# Patient Record
Sex: Male | Born: 2000 | Race: Black or African American | Hispanic: No | Marital: Single | Smoking: Never smoker
Health system: Southern US, Community
[De-identification: ages and names within clinical notes are randomized; demographics above are authoritative.]

## PROBLEM LIST (undated history)

## (undated) DIAGNOSIS — IMO0002 Reserved for concepts with insufficient information to code with codable children: Secondary | ICD-10-CM

## (undated) DIAGNOSIS — S62609A Fracture of unspecified phalanx of unspecified finger, initial encounter for closed fracture: Secondary | ICD-10-CM

## (undated) HISTORY — PX: TONSILLECTOMY AND ADENOIDECTOMY: SHX28

## (undated) HISTORY — DX: Fracture of unspecified phalanx of unspecified finger, initial encounter for closed fracture: S62.609A

## (undated) HISTORY — PX: HERNIA REPAIR: SHX51

## (undated) HISTORY — DX: Reserved for concepts with insufficient information to code with codable children: IMO0002

## (undated) NOTE — *Deleted (*Deleted)
Pt is here with a left hand injury that happened, pt has taken to relieve discomfort.

---
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
## 2018-01-23 ENCOUNTER — Ambulatory Visit: Payer: 59 | Admitting: Pediatrics

## 2018-10-14 ENCOUNTER — Encounter: Payer: Self-pay | Admitting: Pediatrics

## 2020-11-10 ENCOUNTER — Other Ambulatory Visit: Payer: Self-pay

## 2020-11-10 ENCOUNTER — Ambulatory Visit
Admission: EM | Admit: 2020-11-10 | Discharge: 2020-11-10 | Disposition: A | Discharge status: Home / Self Care | Payer: No Typology Code available for payment source | Attending: Family Medicine | Admitting: Family Medicine

## 2020-11-10 ENCOUNTER — Ambulatory Visit (INDEPENDENT_AMBULATORY_CARE_PROVIDER_SITE_OTHER): Payer: No Typology Code available for payment source

## 2020-11-10 DIAGNOSIS — M79642 Pain in left hand: Secondary | ICD-10-CM

## 2020-11-10 DIAGNOSIS — W19XXXA Unspecified fall, initial encounter: Secondary | ICD-10-CM

## 2020-11-10 DIAGNOSIS — S6392XA Sprain of unspecified part of left wrist and hand, initial encounter: Secondary | ICD-10-CM

## 2020-11-10 DIAGNOSIS — S66912A Strain of unspecified muscle, fascia and tendon at wrist and hand level, left hand, initial encounter: Secondary | ICD-10-CM

## 2020-11-10 NOTE — ED Provider Notes (Signed)
[facility]   [id number] 11/10/20 Arrival Time: 1738  SH:FWYOV PAIN  SUBJECTIVE: History from: patient. Ruben George is a 19 y.o. male complains of left hand and thumb pain that began about 3 days ago.  Reports that he fell at work and caught himself on his left hand.  Reports bruising to the base of the left hand.  Has not attempted treatment at home.  Reports pain is worse when putting pressure on the hand like trying to lift himself up out of a chair using his arms. Denies similar symptoms in the past.  Denies fever, chills, erythema, weakness, numbness and tingling, saddle paresthesias, loss of bowel or bladder function.      ROS: As per HPI.  All other pertinent ROS negative.     Past Medical History:  Diagnosis Date  . Finger fracture    multiple  . 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 HERNIA REPAIR    . TONSILLECTOMY AND ADENOIDECTOMY     No Known Allergies No current facility-administered medications on file prior to encounter.   Current Outpatient Medications on File Prior to Encounter  Medication Sig Dispense Refill  . TETRACYCLINE HCL PO Take by mouth.     Social History   Socioeconomic History  . Marital status: Single    Spouse name: n/a  . Number of children: 0  . Years of education: Not on file  . Highest education level: Not on file  Occupational History  . Occupation: Consulting civil engineer  Tobacco Use  . Smoking status: Never Smoker  . Smokeless tobacco: Never Used  Substance and Sexual Activity  . Alcohol use: No    Alcohol/week: 0.0 standard drinks  . Drug use: No  . Sexual activity: Not on file  Other Topics Concern  . Not on file  Social History Narrative   Lives with his mother, brother and sister.   His father lives in [place], with his sister or mother.   Social Determinants of Health   Financial Resource Strain:   . Difficulty of Paying Living Expenses: Not on file    Food Insecurity:   . Worried About Programme researcher, broadcasting/film/video in the Last Year: Not on file  . Ran Out of Food in the Last Year: Not on file  Transportation Needs:   . Lack of Transportation (Medical): Not on file  . Lack of Transportation (Non-Medical): Not on file  Physical Activity:   . Days of Exercise per Week: Not on file  . Minutes of Exercise per Session: Not on file  Stress:   . Feeling of Stress : Not on file  Social Connections:   . Frequency of Communication with Friends and Family: Not on file  . Frequency of Social Gatherings with Friends and Family: Not on file  . Attends Religious Services: Not on file  . Active Member of Clubs or Organizations: Not on file  . Attends Banker Meetings: Not on file  . Marital Status: Not on file  Intimate Partner Violence:   . Fear of Current or Ex-Partner: Not on file  . Emotionally Abused: Not on file  . Physically Abused: Not on file  . Sexually Abused: Not on file   Family History  Problem Relation Age of Onset  . Autism spectrum disorder Brother   . Mental illness Brother        bipolar disorder, ADHD  . Hypertension Mother   .  Sarcoidosis Father     OBJECTIVE:  Vitals:   11/10/20 1811 11/10/20 1812  BP: 129/71   Pulse: (!) 51   Resp: 17   Temp: 98.6 F (37 C)   TempSrc: Oral   SpO2: 98%   Height:  5\' 9"  (1.753 m)    General appearance: ALERT; in no acute distress.  Head: NCAT Lungs: Normal respiratory effort CV: pulses 2+ bilaterally. Cap refill < 2 seconds Musculoskeletal:  Inspection: Skin warm, dry, clear and intact. Bruising and swelling to L palm between L thumb and L wirst  Palpation: Nontender to palpation ROM: FROM active and passive Skin: warm and dry Neurologic: Ambulates without difficulty; Sensation intact about the upper/ lower extremities Psychological: alert and cooperative; normal mood and affect  DIAGNOSTIC STUDIES:  DG Hand Complete Left  Result Date: 11/10/2020 CLINICAL  DATA:  Fall injury EXAM: LEFT HAND - COMPLETE 3+ VIEW COMPARISON:  None. FINDINGS: There is no evidence of fracture or dislocation. There is no evidence of arthropathy or other focal bone abnormality. Soft tissues are unremarkable. IMPRESSION: Negative. Electronically Signed   By: 11/12/2020 M.D.   On: 11/10/2020 19:21     ASSESSMENT & PLAN:  1. Sprain and strain of left hand   2. Left hand pain   3. Fall, initial encounter    Likely hand sprain from the fall Work note provided  Continue conservative management of rest, ice, and gentle stretches Take ibuprofen as needed for pain relief (may cause abdominal discomfort, ulcers, and GI bleeds avoid taking with other NSAIDs)  Follow up with PCP if symptoms persist Return or go to the ER if you have any new or worsening symptoms (fever, chills, chest pain, abdominal pain, changes in bowel or bladder habits, pain radiating into lower legs)    Reviewed expectations re: course of current medical issues. Questions answered. Outlined signs and symptoms indicating need for more acute intervention. Patient verbalized understanding. After Visit Summary given.       11/12/2020, NP 11/10/20 1944

## 2020-11-10 NOTE — Discharge Instructions (Addendum)
X-ray was negative today for any fractures or dislocation  May take ibuprofen as needed.  You may wear a brace as needed.  We cannot rule out soft tissue injury in the office today.  You may apply ice to the area  Follow-up with orthopedics if symptoms are persisting.

## 2021-02-02 ENCOUNTER — Encounter: Payer: Self-pay | Admitting: Emergency Medicine

## 2021-02-02 ENCOUNTER — Ambulatory Visit
Admission: EM | Admit: 2021-02-02 | Discharge: 2021-02-02 | Disposition: A | Discharge status: Home / Self Care | Payer: No Typology Code available for payment source

## 2021-02-02 ENCOUNTER — Other Ambulatory Visit: Payer: Self-pay

## 2021-02-02 DIAGNOSIS — M79672 Pain in left foot: Secondary | ICD-10-CM

## 2021-02-02 DIAGNOSIS — M7750 Other enthesopathy of unspecified foot: Secondary | ICD-10-CM | POA: Diagnosis not present

## 2021-02-02 NOTE — ED Provider Notes (Signed)
[facility]   [id number] 02/02/21 Arrival Time: 1441  YH:CWCBJ PAIN  SUBJECTIVE: History from: patient. Morton A Getter is a 20 y.o. male complains of left foot  pain that began 3-4 days ago. States that she does run and work out. Denies a precipitating event or specific injury.  Localizes the pain to the sole of the midfoot. Describes the pain as intermittent and achy in character. Has tried OTC medications without relief. Symptoms are made worse with activity. Denies similar symptoms in the past. Denies fever, chills, erythema, ecchymosis, effusion, weakness, numbness and tingling, saddle paresthesias, loss of bowel or bladder function.      ROS: As per HPI.  All other pertinent ROS negative.     Past Medical History:  Diagnosis Date  . Finger fracture    multiple  . 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 HERNIA REPAIR    . TONSILLECTOMY AND ADENOIDECTOMY     No Known Allergies No current facility-administered medications on file prior to encounter.   Current Outpatient Medications on File Prior to Encounter  Medication Sig Dispense Refill  . TETRACYCLINE HCL PO Take by mouth.     Social History   Socioeconomic History  . Marital status: Single    Spouse name: n/a  . Number of children: 0  . Years of education: Not on file  . Highest education level: Not on file  Occupational History  . Occupation: Consulting civil engineer  Tobacco Use  . Smoking status: Never Smoker  . Smokeless tobacco: Never Used  Substance and Sexual Activity  . Alcohol use: No    Alcohol/week: 0.0 standard drinks  . Drug use: No  . Sexual activity: Not on file  Other Topics Concern  . Not on file  Social History Narrative   Lives with his mother, brother and sister.   His father lives in [place], with his sister or mother.   Social Determinants of Health   Financial Resource Strain: Not on file  Food Insecurity: Not on file   Transportation Needs: Not on file  Physical Activity: Not on file  Stress: Not on file  Social Connections: Not on file  Intimate Partner Violence: Not on file   Family History  Problem Relation Age of Onset  . Autism spectrum disorder Brother   . Mental illness Brother        bipolar disorder, ADHD  . Hypertension Mother   . Sarcoidosis Father     OBJECTIVE:  Vitals:   02/02/21 1507  BP: 115/74  Pulse: (!) 48  Resp: 17  Temp: 98.3 F (36.8 C)  TempSrc: Oral  SpO2: 97%    General appearance: ALERT; in no acute distress.  Head: NCAT Lungs: Normal respiratory effort CV: pulses 2+ bilaterally. Cap refill < 2 seconds Musculoskeletal:  Inspection: Skin warm, dry, clear and intact No erythema, effusion noted Palpation: mid-lateral sole of L foot tender to palpation ROM: FROM active and passive  Skin: warm and dry Neurologic: Ambulates without difficulty; Sensation intact about the upper/ lower extremities Psychological: alert and cooperative; normal mood and affect  DIAGNOSTIC STUDIES:  No results found.   ASSESSMENT & PLAN:  1. Tendonitis of foot   2. Left foot pain     Continue conservative management of rest, ice, and gentle stretches Take ibuprofen as needed for pain relief (may cause abdominal discomfort, ulcers, and GI bleeds avoid taking with other NSAIDs) Follow up with PCP  if symptoms persist Return or go to the ER if you have any new or worsening symptoms (fever, chills, chest pain, abdominal pain, changes in bowel or bladder habits, pain radiating into lower legs)  Reviewed expectations re: course of current medical issues. Questions answered. Outlined signs and symptoms indicating need for more acute intervention. Patient verbalized understanding. After Visit Summary given.       Moshe Cipro, NP 02/02/21 1526

## 2021-02-02 NOTE — Discharge Instructions (Addendum)
Take ibuprofen as needed. Rest and elevate your foot. Apply ice packs 2-3 times a day for up to 20 minutes each.    Follow up with your primary care provider or an orthopedist if you symptoms continue or worsen; Or if you develop new symptoms, such as numbness, tingling, or weakness.

## 2021-02-02 NOTE — ED Triage Notes (Signed)
LT foot pain on the bottom of foot for the past couple of days. No know injury.  States he does run and work out

## 2021-04-21 ENCOUNTER — Encounter: Payer: Self-pay | Admitting: Emergency Medicine

## 2021-04-21 ENCOUNTER — Other Ambulatory Visit: Payer: Self-pay

## 2021-04-21 ENCOUNTER — Ambulatory Visit
Admission: EM | Admit: 2021-04-21 | Discharge: 2021-04-21 | Disposition: A | Discharge status: Home / Self Care | Payer: No Typology Code available for payment source | Attending: Family Medicine | Admitting: Family Medicine

## 2021-04-21 DIAGNOSIS — S86912A Strain of unspecified muscle(s) and tendon(s) at lower leg level, left leg, initial encounter: Secondary | ICD-10-CM

## 2021-04-21 MED ORDER — NAPROXEN 375 MG PO TABS: 375.0000 mg | ORAL_TABLET | Freq: Two times a day (BID) | ORAL | 0 refills | Status: AC

## 2021-04-21 NOTE — ED Provider Notes (Signed)
[facility]    CSN: [id number] Arrival date & time: 04/21/21  1324      History   Chief Complaint Chief Complaint  Patient presents with  . Knee Pain    HPI Ruben George is a 20 y.o. male.   HPI   Patient in with left knee pressure and clicking since weight lifting a few days ago. He applied ice to knee the day of injury. He denies pain involving the left knee but endorses abnormal sensation of pressure and clicking mid knee. No knee swelling or impaired ROM of knee. He has not taken medication since injury occurred.  Past Medical History:  Diagnosis Date  . Finger fracture    multiple  . 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   Past Surgical History:  Procedure Laterality Date  . HERNIA REPAIR    . TONSILLECTOMY AND ADENOIDECTOMY         Home Medications    Prior to Admission medications   Medication Sig Start Date End Date Taking? Authorizing Provider  naproxen (NAPROSYN) 375 MG tablet Take 1 tablet (375 mg total) by mouth 2 (two) times daily. 04/21/21  Yes Bing Neighbors, FNP  TETRACYCLINE HCL PO Take by mouth.    [provider]    Family History Family History  Problem Relation Age of Onset  . Autism spectrum disorder Brother   . Mental illness Brother        bipolar disorder, ADHD  . Hypertension Mother   . Sarcoidosis Father     Social History Social History   Tobacco Use  . Smoking status: Never Smoker  . Smokeless tobacco: Never Used  Substance Use Topics  . Alcohol use: No    Alcohol/week: 0.0 standard drinks  . Drug use: No     Allergies   Patient has no known allergies.   Review of Systems Review of Systems   Physical Exam Triage Vital Signs ED Triage Vitals [04/21/21 1410]  Enc Vitals Group     BP      Pulse      Resp      Temp      Temp src      SpO2      Weight      Height      Head Circumference      Peak Flow       Pain Score 3     Pain Loc      Pain Edu?      Excl. in GC?    No data found.  Updated Vital Signs BP 117/79 (BP Location: Right Arm)   Pulse 82   Temp 98 F (36.7 C) (Oral)   Resp 17   SpO2 99%   Visual Acuity Right Eye Distance:   Left Eye Distance:   Bilateral Distance:    Right Eye Near:   Left Eye Near:    Bilateral Near:     Physical Exam General appearance: alert, well developed, well nourished, cooperative Head: Normocephalic, without obvious abnormality, atraumatic Respiratory: Respirations even and unlabored, normal respiratory rate Heart: rate and rhythm normal. No gallop or murmurs noted on exam  Abdomen: BS +, no distention, no rebound tenderness, or no mass Extremities: Left knee normal ,gross deformities, full ROM, no crepitus noted Skin: Skin color, texture, turgor normal. No rashes seen  Psych: Appropriate mood and affect. Neurologic: GCS 15, normal  gait, normal coordination   UC Treatments / Results  Labs (all labs ordered are listed, but only abnormal results are displayed) Labs Reviewed - No data to display  EKG   Radiology No results found.  Procedures Procedures (including critical care time)  Medications Ordered in UC Medications - No data to display  Initial Impression / Assessment and Plan / UC Course  I have reviewed the triage vital signs and the nursing notes.  Pertinent labs & imaging results that were available during my care of the patient were reviewed by me and considered in my medical decision making (see chart for details).    Knee appears normal and intact with full ROM. Ace wrap, RICE, NSAIDS Orthopedics follow-up if symptoms persist. Final Clinical Impressions(s) / UC Diagnoses   Final diagnoses:  Strain of left knee, initial encounter   Discharge Instructions   None    ED Prescriptions    Medication Sig Dispense Auth. Provider   naproxen (NAPROSYN) 375 MG tablet Take 1 tablet (375 mg total) by mouth 2 (two)  times daily. 20 tablet Bing Neighbors, FNP     PDMP not reviewed this encounter.   Bing Neighbors, FNP 04/21/21 [phone]

## 2021-04-21 NOTE — ED Triage Notes (Addendum)
Pt had some pain a couple of days ago to LT knee after weightlifting.  Now pt reports he is having pressure to that area but no pain.  Ambulating with no difficulty from waiting room to exam room.

## 2021-08-20 IMAGING — DX DG HAND COMPLETE 3+V*L*
3 series · 3 of 3 positions shown · non-contrast
Comparison: None.

CLINICAL DATA: Fall injury

EXAM:
LEFT HAND - COMPLETE 3+ VIEW

[hand pa]
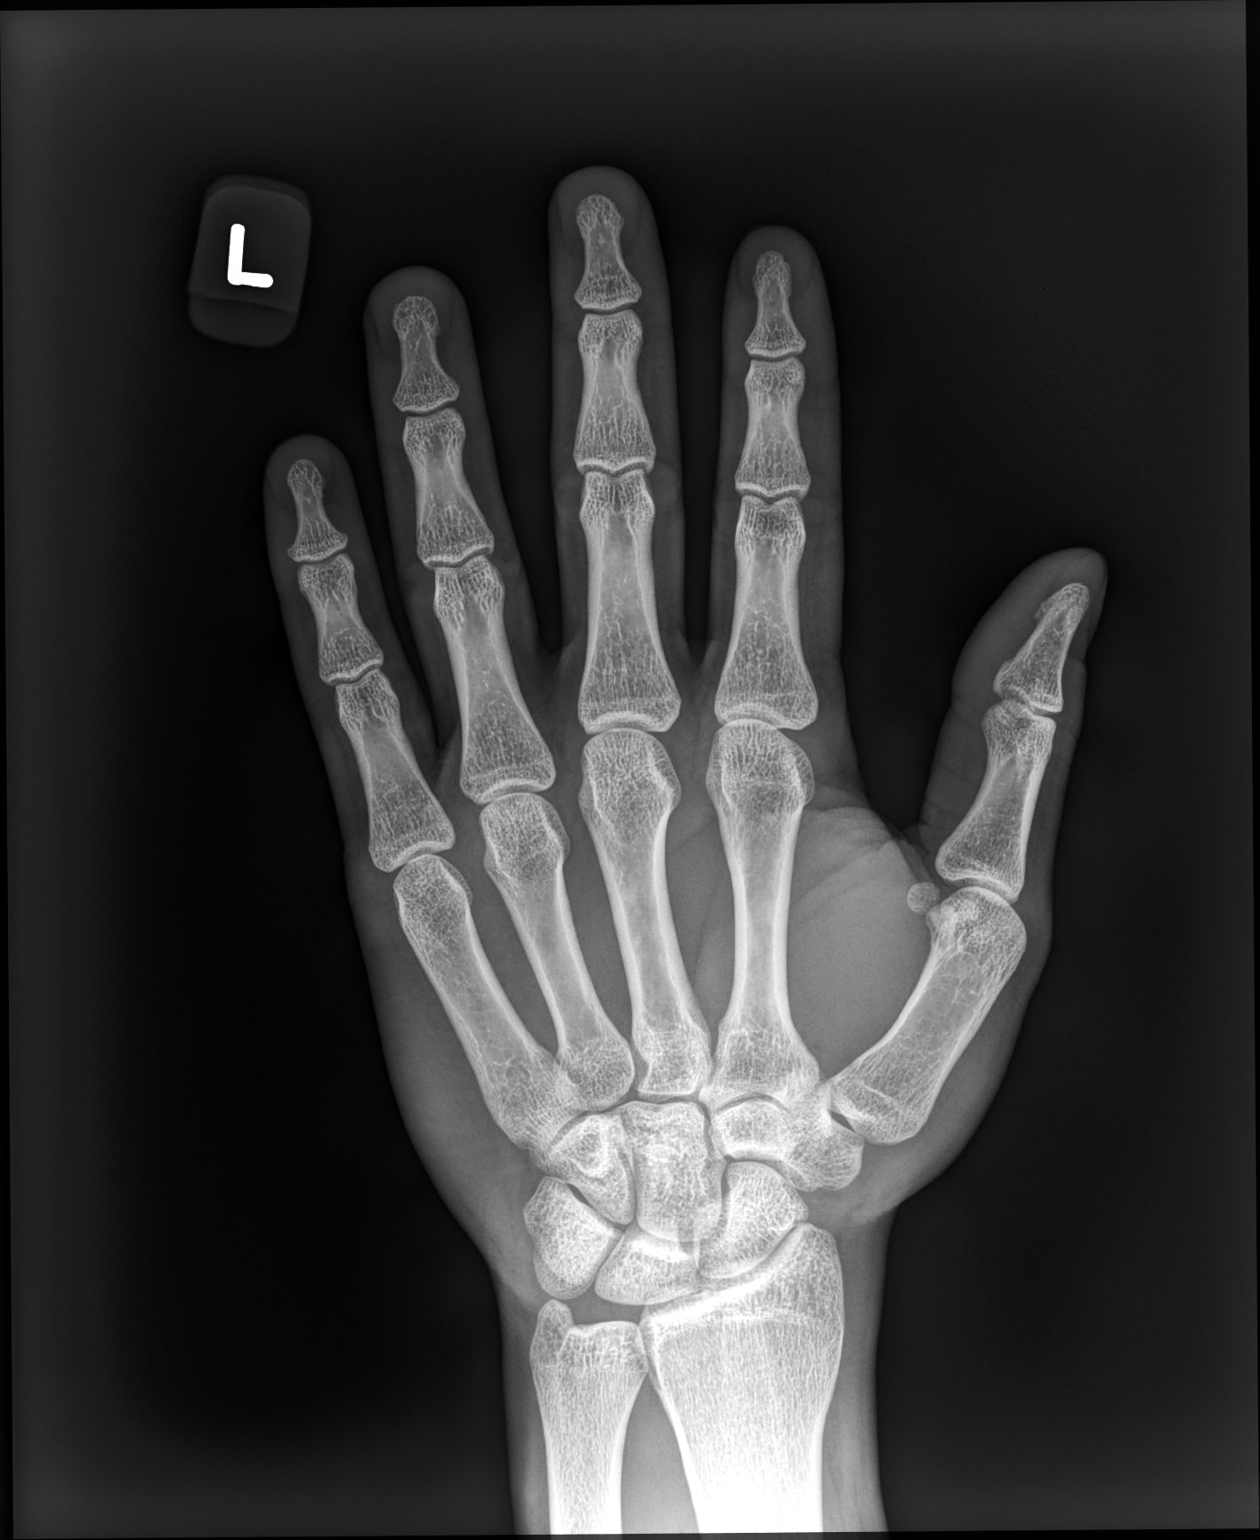

[hand mlo]
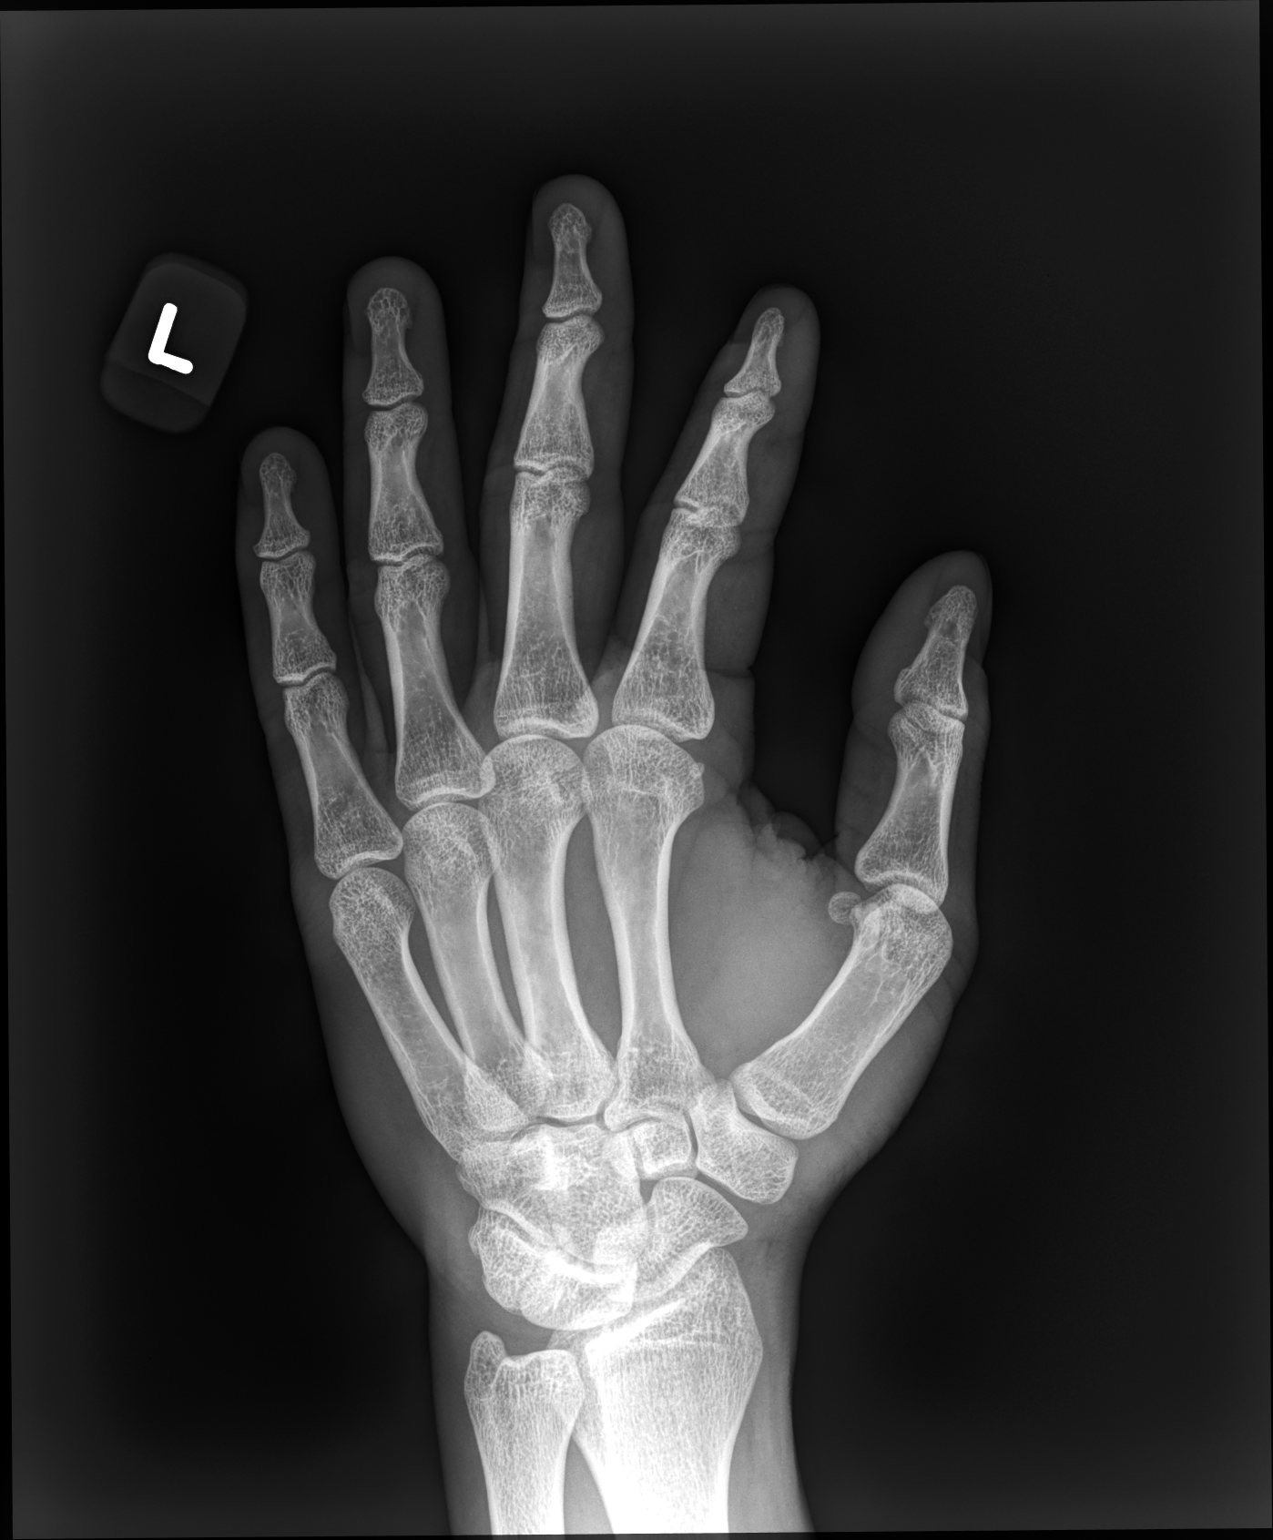

[hand lat]
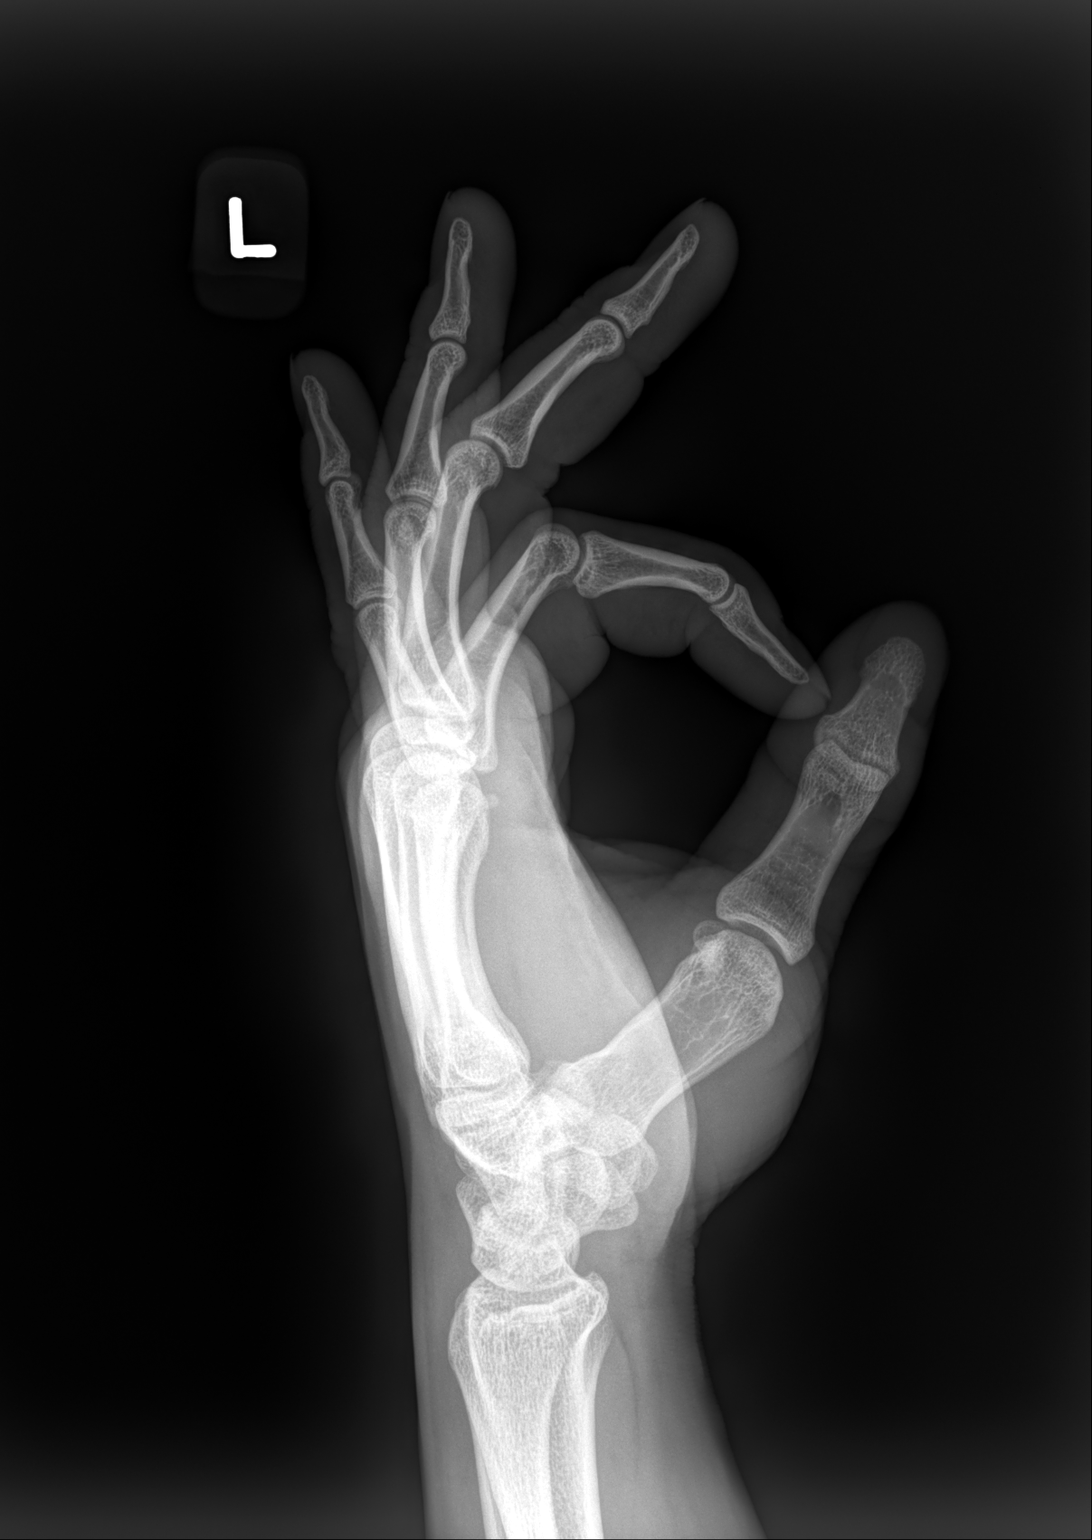

[3 of 3 positions shown; findings below may reference images not displayed]

FINDINGS: There is no evidence of fracture or dislocation. There is no
evidence of arthropathy or other focal bone abnormality. Soft
tissues are unremarkable.
IMPRESSION: Negative.

## 2023-04-29 ENCOUNTER — Ambulatory Visit
Admission: EM | Admit: 2023-04-29 | Discharge: 2023-04-29 | Disposition: A | Discharge status: Home / Self Care | Payer: No Typology Code available for payment source | Attending: Nurse Practitioner | Admitting: Nurse Practitioner

## 2023-04-29 DIAGNOSIS — J029 Acute pharyngitis, unspecified: Secondary | ICD-10-CM

## 2023-04-29 LAB — POCT RAPID STREP A (OFFICE): Rapid Strep A Screen: NEGATIVE

## 2023-04-29 NOTE — ED Notes (Signed)
Duplicate order placed for strep. Only one swab was collected and completed.

## 2023-04-29 NOTE — ED Triage Notes (Signed)
Pt c/o sore throat x 2 days, pt has used cough drops to help with pain  TheraFlu and hydrating

## 2023-04-29 NOTE — ED Provider Notes (Signed)
[facility]    CSN: [id number] Arrival date & time: 04/29/23  1721      History   Chief Complaint No chief complaint on file.   HPI Ruben George is a 22 y.o. male.   Patient presents today with 6-day history of dry cough, runny and stuffy nose, sore throat.  Reports symptoms have been improving over the past couple of days.  He denies fever, body aches or chills, shortness of breath or chest pain, headache, ear pain, abdominal pain, nausea/vomiting, diarrhea, decreased appetite, loss of taste or smell, and fatigue.  Reports he took an at-home COVID test yesterday that was negative.  No known sick contacts.  Has been taking cough drops and TheraFlu for symptoms with improvement.  He also been trying to stay plenty hydrated with water.    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   Past Surgical History:  Procedure Laterality Date   HERNIA REPAIR     TONSILLECTOMY AND ADENOIDECTOMY         Home Medications    Prior to Admission medications   Medication Sig Start Date End Date Taking? Authorizing Provider  naproxen (NAPROSYN) 375 MG tablet Take 1 tablet (375 mg total) by mouth 2 (two) times daily. 04/21/21   Bing Neighbors, NP  TETRACYCLINE HCL PO Take by mouth.    [provider]    Family History Family History  Problem Relation Age of Onset   Autism spectrum disorder Brother    Mental illness Brother        bipolar disorder, ADHD   Hypertension Mother    Sarcoidosis Father     Social History Social History   Tobacco Use   Smoking status: Never   Smokeless tobacco: Never  Substance Use Topics   Alcohol use: No    Alcohol/week: 0.0 standard drinks of alcohol   Drug use: No     Allergies   Patient has no known allergies.   Review of Systems Review of Systems Per HPI  Physical  Exam Triage Vital Signs ED Triage Vitals  Enc Vitals Group     BP 04/29/23 1842 102/66     Pulse Rate 04/29/23 1842 (!) 51     Resp 04/29/23 1842 15     Temp 04/29/23 1842 98.3 F (36.8 C)     Temp Source 04/29/23 1842 Oral     SpO2 04/29/23 1842 98 %     Weight --      Height --      Head Circumference --      Peak Flow --      Pain Score 04/29/23 1846 3     Pain Loc --      Pain Edu? --      Excl. in GC? --    No data found.  Updated Vital Signs BP 102/66 (BP Location: Right Arm)   Pulse (!) 51   Temp 98.3 F (36.8 C) (Oral)   Resp 15   SpO2 98%   Visual Acuity Right Eye Distance:   Left Eye Distance:   Bilateral Distance:    Right Eye Near:   Left Eye Near:    Bilateral Near:     Physical Exam Vitals and nursing note reviewed.  Constitutional:      General: He is not  in acute distress.    Appearance: Normal appearance. He is not ill-appearing or toxic-appearing.  HENT:     Head: Normocephalic and atraumatic.     Right Ear: Tympanic membrane, ear canal and external ear normal.     Left Ear: Tympanic membrane, ear canal and external ear normal.     Nose: No congestion or rhinorrhea.     Mouth/Throat:     Mouth: Mucous membranes are moist.     Pharynx: Oropharynx is clear. No oropharyngeal exudate or posterior oropharyngeal erythema.  Eyes:     General: No scleral icterus.    Extraocular Movements: Extraocular movements intact.  Cardiovascular:     Rate and Rhythm: Normal rate and regular rhythm.  Pulmonary:     Effort: Pulmonary effort is normal. No respiratory distress.     Breath sounds: Normal breath sounds. No wheezing, rhonchi or rales.  Musculoskeletal:     Cervical back: Normal range of motion and neck supple.  Lymphadenopathy:     Cervical: No cervical adenopathy.  Skin:    General: Skin is warm and dry.     Coloration: Skin is not jaundiced or pale.     Findings: No erythema or rash.  Neurological:     Mental Status: He is alert and  oriented to person, place, and time.  Psychiatric:        Behavior: Behavior is cooperative.      UC Treatments / Results  Labs (all labs ordered are listed, but only abnormal results are displayed) Labs Reviewed  POCT RAPID STREP A (OFFICE)  POCT RAPID STREP A (OFFICE)    EKG   Radiology No results found.  Procedures Procedures (including critical care time)  Medications Ordered in UC Medications - No data to display  Initial Impression / Assessment and Plan / UC Course  I have reviewed the triage vital signs and the nursing notes.  Pertinent labs & imaging results that were available during my care of the patient were reviewed by me and considered in my medical decision making (see chart for details).   Patient is well-appearing, normotensive, afebrile, not tachycardic, not tachypneic, oxygenating well on room air.    1. Acute pharyngitis, unspecified etiology Rapid strep throat test today is negative Throat culture deferred given examination and symptoms are improving Centor score today is 0 COVID-19 testing deferred by patient Supportive care discussed ER and return precautions discussed Note given for work  The patient was given the opportunity to ask questions.  All questions answered to their satisfaction.  The patient is in agreement to this plan.    Final Clinical Impressions(s) / UC Diagnoses   Final diagnoses:  Acute pharyngitis, unspecified etiology     Discharge Instructions      You have a viral upper respiratory infection.  Symptoms should improve over the next week to 10 days.  If you develop chest pain or shortness of breath, go to the emergency room.  Some things that can make you feel better are: - Increased rest - Increasing fluid with water/sugar free electrolytes - Acetaminophen and ibuprofen as needed for fever/pain - Salt water gargling, chloraseptic spray and throat lozenges for sore throat - Saline sinus flushes or a neti pot -  Humidifying the air     ED Prescriptions   None    PDMP not reviewed this encounter.   Valentino Nose, NP 04/29/23 1911

## 2023-04-29 NOTE — Discharge Instructions (Signed)
You have a viral upper respiratory infection.  Symptoms should improve over the next week to 10 days.  If you develop chest pain or shortness of breath, go to the emergency room.  Some things that can make you feel better are: - Increased rest - Increasing fluid with water/sugar free electrolytes - Acetaminophen and ibuprofen as needed for fever/pain - Salt water gargling, chloraseptic spray and throat lozenges for sore throat - Saline sinus flushes or a neti pot - Humidifying the air

## 2023-04-30 ENCOUNTER — Ambulatory Visit
Admission: RE | Admit: 2023-04-30 | Discharge: 2023-04-30 | Disposition: A | Discharge status: Home / Self Care | Payer: Self-pay | Source: Ambulatory Visit | Attending: Nurse Practitioner | Admitting: Nurse Practitioner

## 2023-04-30 VITALS — BP 128/73 | HR 45 | Temp 98.2°F | Resp 16

## 2023-04-30 DIAGNOSIS — J069 Acute upper respiratory infection, unspecified: Secondary | ICD-10-CM

## 2023-04-30 DIAGNOSIS — Z1152 Encounter for screening for COVID-19: Secondary | ICD-10-CM

## 2023-04-30 DIAGNOSIS — H5789 Other specified disorders of eye and adnexa: Secondary | ICD-10-CM

## 2023-04-30 MED ORDER — POLYMYXIN B-TRIMETHOPRIM 10000-0.1 UNIT/ML-% OP SOLN: 1.0000 [drp] | Freq: Four times a day (QID) | OPHTHALMIC | 0 refills | Status: AC

## 2023-04-30 NOTE — Discharge Instructions (Signed)
COVID test is pending.  You will be contacted if the pending test result is positive.  As discussed, you can also activate your MyChart account to see your results there. Use eyedrops as prescribed.  Recommend using them in both eyes in the event that the infection spreads. Cool compresses to the eyes to help with pain or swelling as needed. May continue the over-the-counter eyedrops you are using to help keep the eyes moist and decreased redness. Strict handwashing when applying medication.  Avoid rubbing or manipulating the eyes while symptoms persist. Follow-up if symptoms do not improve.

## 2023-04-30 NOTE — ED Triage Notes (Addendum)
Pt c/o of eye problem right eye drainage crusting over at night started yesterday. Pt's job is requesting a covid test. Sore throat small cough.

## 2023-04-30 NOTE — ED Provider Notes (Signed)
[facility]    CSN: [id number] Arrival date & time: 04/30/23  1037      History   Chief Complaint Chief Complaint  Patient presents with   Eye Problem    And cold virus and sore throat - Entered by patient    HPI Ruben George is a 22 y.o. male.   The history is provided by the patient.   Patient presents for complaints of redness and drainage in the right that started over the past 24 hours.  Patient states when he woke up, he had crusting in the right eye.  He states that the eye has also become more red.  He states that he was seen in this clinic 1 day ago for a sore throat.  He states his throat pain is better.  Patient denies fever, chills, neck pain, ear drainage, visual changes, eye swelling, light sensitivity, or blurred vision.  Patient is requesting a COVID test.  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   Past Surgical History:  Procedure Laterality Date   HERNIA REPAIR     TONSILLECTOMY AND ADENOIDECTOMY         Home Medications    Prior to Admission medications   Medication Sig Start Date End Date Taking? Authorizing Provider  naproxen (NAPROSYN) 375 MG tablet Take 1 tablet (375 mg total) by mouth 2 (two) times daily. 04/21/21   Bing Neighbors, NP  TETRACYCLINE HCL PO Take by mouth.    [provider]  trimethoprim-polymyxin b (POLYTRIM) ophthalmic solution Place 1 drop into both eyes every 6 (six) hours for 7 days. 04/30/23 05/07/23 Yes Keilyn Haggard-Warren, Sadie Haber, NP    Family History Family History  Problem Relation Age of Onset   Autism spectrum disorder Brother    Mental illness Brother        bipolar disorder, ADHD   Hypertension Mother    Sarcoidosis Father     Social History Social History   Tobacco Use   Smoking status: Never   Smokeless tobacco: Never  Substance Use  Topics   Alcohol use: No    Alcohol/week: 0.0 standard drinks of alcohol   Drug use: No     Allergies   Patient has no known allergies.   Review of Systems Review of Systems Per HPI  Physical Exam Triage Vital Signs ED Triage Vitals  Enc Vitals Group     BP 04/30/23 1059 128/73     Pulse Rate 04/30/23 1059 (!) 45     Resp 04/30/23 1059 16     Temp 04/30/23 1059 98.2 F (36.8 C)     Temp Source 04/30/23 1059 Oral     SpO2 04/30/23 1059 98 %     Weight --      Height --      Head Circumference --      Peak Flow --      Pain Score 04/30/23 1102 0     Pain Loc --      Pain Edu? --      Excl. in GC? --    No data found.  Updated Vital Signs BP 128/73 (BP Location: Right Arm)   Pulse (!) 45   Temp 98.2 F (36.8 C) (Oral)   Resp 16   SpO2 98%   Visual Acuity Right Eye  Distance:   Left Eye Distance:   Bilateral Distance:    Right Eye Near:   Left Eye Near:    Bilateral Near:     Physical Exam Vitals and nursing note reviewed.  Constitutional:      General: He is not in acute distress.    Appearance: Normal appearance.  HENT:     Head: Normocephalic.     Right Ear: Tympanic membrane, ear canal and external ear normal.     Left Ear: Tympanic membrane, ear canal and external ear normal.     Nose: Nose normal.     Mouth/Throat:     Mouth: Mucous membranes are moist.     Pharynx: No posterior oropharyngeal erythema.  Eyes:     General: Lids are normal. Vision grossly intact. No visual field deficit.       Right eye: No foreign body, discharge or hordeolum.        Left eye: No foreign body, discharge or hordeolum.     Extraocular Movements: Extraocular movements intact.     Right eye: Normal extraocular motion and no nystagmus.     Conjunctiva/sclera:     Right eye: No chemosis, exudate or hemorrhage.    Pupils: Pupils are equal, round, and reactive to light.     Comments: Right conjunctiva erythematous. No discharge or purulent drainage present.   Cardiovascular:     Rate and Rhythm: Regular rhythm. Bradycardia present.     Pulses: Normal pulses.     Heart sounds: Normal heart sounds.  Pulmonary:     Effort: Pulmonary effort is normal. No respiratory distress.     Breath sounds: Normal breath sounds. No stridor. No wheezing, rhonchi or rales.  Abdominal:     General: Bowel sounds are normal.     Palpations: Abdomen is soft.     Tenderness: There is no abdominal tenderness.  Musculoskeletal:     Cervical back: Normal range of motion.  Lymphadenopathy:     Cervical: No cervical adenopathy.  Skin:    General: Skin is warm and dry.  Neurological:     General: No focal deficit present.     Mental Status: He is alert and oriented to person, place, and time.  Psychiatric:        Mood and Affect: Mood normal.        Behavior: Behavior normal.      UC Treatments / Results  Labs (all labs ordered are listed, but only abnormal results are displayed) Labs Reviewed  SARS CORONAVIRUS 2 (TAT 6-24 HRS)    EKG   Radiology No results found.  Procedures Procedures (including critical care time)  Medications Ordered in UC Medications - No data to display  Initial Impression / Assessment and Plan / UC Course  I have reviewed the triage vital signs and the nursing notes.  Pertinent labs & imaging results that were available during my care of the patient were reviewed by me and considered in my medical decision making (see chart for details).  The patient is well-appearing, he is in no acute distress, vital signs are stable.  Suspect a viral conjunctivitis of the right eye; however, will treat empirically with Polytrim ophthalmic solution.  Supportive care recommendations were provided and discussed with the patient to include use of cool compresses as needed for pain or swelling, use of over-the-counter analgesics for pain or discomfort, and strict hand hygiene.  Patient requested COVID test, which is pending.  Patient was  advised he will be contacted  if the COVID test is positive.  Patient was advised that due to the duration of his symptoms, he is out of the window to receive treatment if the COVID test is positive.  Patient was advised to follow-up in this clinic or with his PCP if symptoms fail to improve.  Patient is in agreement with this plan of care and verbalizes understanding.  All questions were answered.  Patient stable for discharge. Work note was provided.    Final Clinical Impressions(s) / UC Diagnoses   Final diagnoses:  Viral upper respiratory infection  Eye redness  Encounter for screening for COVID-19     Discharge Instructions      COVID test is pending.  You will be contacted if the pending test result is positive.  As discussed, you can also activate your MyChart account to see your results there. Use eyedrops as prescribed.  Recommend using them in both eyes in the event that the infection spreads. Cool compresses to the eyes to help with pain or swelling as needed. May continue the over-the-counter eyedrops you are using to help keep the eyes moist and decreased redness. Strict handwashing when applying medication.  Avoid rubbing or manipulating the eyes while symptoms persist. Follow-up if symptoms do not improve.      ED Prescriptions     Medication Sig Dispense Auth. Provider   trimethoprim-polymyxin b (POLYTRIM) ophthalmic solution Place 1 drop into both eyes every 6 (six) hours for 7 days. 10 mL Hoby Kawai-Warren, Sadie Haber, NP      PDMP not reviewed this encounter.   Abran Cantor, NP 04/30/23 1148

## 2023-05-01 LAB — SARS CORONAVIRUS 2 (TAT 6-24 HRS): SARS Coronavirus 2: NEGATIVE

## 2023-12-04 ENCOUNTER — Ambulatory Visit
Admission: EM | Admit: 2023-12-04 | Discharge: 2023-12-04 | Disposition: A | Discharge status: Home / Self Care | Payer: BC Managed Care – PPO | Attending: Family Medicine | Admitting: Family Medicine

## 2023-12-04 DIAGNOSIS — Z23 Encounter for immunization: Secondary | ICD-10-CM

## 2023-12-04 DIAGNOSIS — S61011A Laceration without foreign body of right thumb without damage to nail, initial encounter: Secondary | ICD-10-CM

## 2023-12-04 MED ORDER — TETANUS-DIPHTH-ACELL PERTUSSIS 5-2.5-18.5 LF-MCG/0.5 IM SUSY
0.5000 mL | PREFILLED_SYRINGE | Freq: Once | INTRAMUSCULAR | Status: AC
  Administered 2023-12-04: 0.5 mL via INTRAMUSCULAR

## 2023-12-04 MED ORDER — CHLORHEXIDINE GLUCONATE 4 % EX SOLN: Freq: Every day | CUTANEOUS | 0 refills | Status: AC | PRN

## 2023-12-04 MED ORDER — MUPIROCIN 2 % EX OINT: 1.0000 | TOPICAL_OINTMENT | Freq: Two times a day (BID) | CUTANEOUS | 0 refills | Status: AC

## 2023-12-04 MED ORDER — LIDOCAINE-EPINEPHRINE-TETRACAINE (LET) TOPICAL GEL
3.0000 mL | Freq: Once | TOPICAL | Status: AC
  Administered 2023-12-04: 3 mL via TOPICAL

## 2023-12-04 NOTE — ED Triage Notes (Signed)
Pt states laceration to right thumb states he cut it with a pocket knife. Bleeding controlled.

## 2023-12-04 NOTE — ED Provider Notes (Signed)
[facility]    CSN: [id number] Arrival date & time: 12/04/23  1823      History   Chief Complaint Chief Complaint  Patient presents with   Laceration    HPI Ruben George is a 22 y.o. male.   Patient presenting today with a laceration to the right thumb that occurred about 30 minutes prior to arrival.  Cut it on a pocket knife.  States pain is under good control, bleeding well-controlled with pressure, and denies decreased range of motion, numbness, tingling.  So far not try anything over-the-counter for symptoms apart from direct pressure.    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   Past Surgical History:  Procedure Laterality Date   HERNIA REPAIR     TONSILLECTOMY AND ADENOIDECTOMY         Home Medications    Prior to Admission medications   Medication Sig Start Date End Date Taking? Authorizing Provider  chlorhexidine (HIBICLENS) 4 % external liquid Apply topically daily as needed. 12/04/23  Yes Particia Nearing, PA-C  mupirocin ointment (BACTROBAN) 2 % Apply 1 Application topically 2 (two) times daily. 12/04/23  Yes Particia Nearing, PA-C  naproxen (NAPROSYN) 375 MG tablet Take 1 tablet (375 mg total) by mouth 2 (two) times daily. 04/21/21   Bing Neighbors, NP  TETRACYCLINE HCL PO Take by mouth.    [provider]    Family History Family History  Problem Relation Age of Onset   Autism spectrum disorder Brother    Mental illness Brother        bipolar disorder, ADHD   Hypertension Mother    Sarcoidosis Father     Social History Social History   Tobacco Use   Smoking status: Never   Smokeless tobacco: Never  Substance Use Topics   Alcohol use: No    Alcohol/week: 0.0 standard drinks of alcohol   Drug use: No     Allergies   Patient has no known  allergies.   Review of Systems Review of Systems Per HPI  Physical Exam Triage Vital Signs ED Triage Vitals  Encounter Vitals Group     BP 12/04/23 1830 118/70     Systolic BP Percentile --      Diastolic BP Percentile --      Pulse Rate 12/04/23 1830 (!) 52     Resp 12/04/23 1830 16     Temp 12/04/23 1830 98.1 F (36.7 C)     Temp Source 12/04/23 1830 Oral     SpO2 12/04/23 1830 98 %     Weight --      Height --      Head Circumference --      Peak Flow --      Pain Score 12/04/23 1831 2     Pain Loc --      Pain Education --      Exclude from Growth Chart --    No data found.  Updated Vital Signs BP 118/70 (BP Location: Right Arm)   Pulse (!) 52   Temp 98.1 F (36.7 C) (Oral)   Resp 16   SpO2 98%   Visual Acuity Right Eye Distance:   Left Eye Distance:   Bilateral Distance:    Right Eye Near:   Left Eye Near:  Bilateral Near:     Physical Exam Vitals and nursing note reviewed.  Constitutional:      Appearance: Normal appearance.  HENT:     Head: Atraumatic.  Eyes:     Extraocular Movements: Extraocular movements intact.     Conjunctiva/sclera: Conjunctivae normal.  Cardiovascular:     Rate and Rhythm: Normal rate and regular rhythm.  Pulmonary:     Effort: Pulmonary effort is normal.     Breath sounds: Normal breath sounds.  Musculoskeletal:        General: Tenderness and signs of injury present. Normal range of motion.     Cervical back: Normal range of motion and neck supple.  Skin:    General: Skin is warm.     Comments: 1.5 cm superficial linear laceration, well-approximated in a neutral position to the dorsal right thumb over joint.  Bleeding well-controlled.  No foreign body appreciable  Neurological:     General: No focal deficit present.     Mental Status: He is oriented to person, place, and time.     Comments: Right upper extremity neurovascularly intact  Psychiatric:        Mood and Affect: Mood normal.        Thought Content:  Thought content normal.        Judgment: Judgment normal.      UC Treatments / Results  Labs (all labs ordered are listed, but only abnormal results are displayed) Labs Reviewed - No data to display  EKG   Radiology No results found.  Procedures Laceration Repair  Date/Time: 12/04/2023 7:44 PM  Performed by: Particia Nearing, PA-C Authorized by: Particia Nearing, PA-C   Consent:    Consent obtained:  Verbal   Consent given by:  Patient   Risks, benefits, and alternatives were discussed: yes     Risks discussed:  Poor cosmetic result, infection and poor wound healing   Alternatives discussed:  Observation Universal protocol:    Procedure explained and questions answered to patient or proxy's satisfaction: yes     Relevant documents present and verified: yes     Patient identity confirmed:  Verbally with patient and arm band Anesthesia:    Anesthesia method:  Topical application   Topical anesthetic:  LET Laceration details:    Location:  Finger   Finger location:  R thumb   Length (cm):  1.5   Depth (mm):  2 Pre-procedure details:    Preparation:  Patient was prepped and draped in usual sterile fashion Exploration:    Limited defect created (wound extended): no     Hemostasis achieved with:  LET and direct pressure   Wound exploration: wound explored through full range of motion     Wound extent: no foreign bodies/material noted     Contaminated: no   Treatment:    Area cleansed with:  Chlorhexidine   Amount of cleaning:  Standard   Irrigation solution:  Sterile saline   Irrigation method:  Pressure wash   Visualized foreign bodies/material removed: no   Skin repair:    Repair method:  Tissue adhesive Approximation:    Approximation:  Close Repair type:    Repair type:  Simple Post-procedure details:    Dressing:  Non-adherent dressing and splint for protection   Procedure completion:  Tolerated well, no immediate complications  (including  critical care time)  Medications Ordered in UC Medications  lidocaine-EPINEPHrine-tetracaine (LET) topical gel (3 mLs Topical Given 12/04/23 1847)  Tdap (BOOSTRIX) injection 0.5 mL (0.5  mLs Intramuscular Given 12/04/23 1846)    Initial Impression / Assessment and Plan / UC Course  I have reviewed the triage vital signs and the nursing notes.  Pertinent labs & imaging results that were available during my care of the patient were reviewed by me and considered in my medical decision making (see chart for details).     Wound cleaned, let gel applied for hemostasis, Dermabond placed for closure.  Dressing applied, splint placed for protection and discussed home wound care with Hibiclens and mupirocin once Dermabond comes off.  Tetanus updated.  Return for worsening symptoms.  Work note given.  Final Clinical Impressions(s) / UC Diagnoses   Final diagnoses:  Laceration of right thumb without foreign body without damage to nail, initial encounter     Discharge Instructions      Once the glue peels off over the next few days, you may start cleaning the area at least once a day with Hibiclens and apply mupirocin and a nonstick dressing.  Keep the finger splint on for about 5 to 7 days to keep the finger in a neutral position while the area heals since it is over a joint.  Ibuprofen and Tylenol as needed for pain.    ED Prescriptions     Medication Sig Dispense Auth. Provider   chlorhexidine (HIBICLENS) 4 % external liquid Apply topically daily as needed. 236 mL Particia Nearing, PA-C   mupirocin ointment (BACTROBAN) 2 % Apply 1 Application topically 2 (two) times daily. 22 g Particia Nearing, [place]      PDMP not reviewed this encounter.   Particia Nearing, [place] 12/04/23 1945

## 2023-12-04 NOTE — Discharge Instructions (Signed)
Once the glue peels off over the next few days, you may start cleaning the area at least once a day with Hibiclens and apply mupirocin and a nonstick dressing.  Keep the finger splint on for about 5 to 7 days to keep the finger in a neutral position while the area heals since it is over a joint.  Ibuprofen and Tylenol as needed for pain.

## 2024-08-16 ENCOUNTER — Ambulatory Visit
Admission: EM | Admit: 2024-08-16 | Discharge: 2024-08-16 | Disposition: A | Discharge status: Home / Self Care | Attending: Family Medicine | Admitting: Family Medicine

## 2024-08-16 DIAGNOSIS — S161XXA Strain of muscle, fascia and tendon at neck level, initial encounter: Secondary | ICD-10-CM

## 2024-08-16 MED ORDER — NAPROXEN 500 MG PO TABS: 500.0000 mg | ORAL_TABLET | Freq: Two times a day (BID) | ORAL | 0 refills | Status: AC | PRN

## 2024-08-16 MED ORDER — TIZANIDINE HCL 4 MG PO CAPS: 4.0000 mg | ORAL_CAPSULE | Freq: Three times a day (TID) | ORAL | 0 refills | Status: AC | PRN

## 2024-08-16 NOTE — ED Provider Notes (Signed)
 [facility]    CSN: [id number] Arrival date & time: 08/16/24  1538      History   Chief Complaint No chief complaint on file.   HPI Ruben George is a 23 y.o. male.   Patient presenting today with 1 week history of bilateral neck pain, stiffness, soreness.  States he does work out a lot but did not note any certain move that caused pain initially, felt it the next day.  Denies loss of range of motion, weakness numbness or tingling down the arms, dizziness, headache, nausea, vomiting, chest pain, shortness of breath.  Trying ice and heat with good temporary relief.    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   Past Surgical History:  Procedure Laterality Date   HERNIA REPAIR     TONSILLECTOMY AND ADENOIDECTOMY         Home Medications    Prior to Admission medications   Medication Sig Start Date End Date Taking? Authorizing Provider  naproxen  (NAPROSYN ) 500 MG tablet Take 1 tablet (500 mg total) by mouth 2 (two) times daily as needed. 08/16/24  Yes Stuart Vernell Norris, PA-C  tiZANidine  (ZANAFLEX ) 4 MG capsule Take 1 capsule (4 mg total) by mouth 3 (three) times daily as needed for muscle spasms. Do not drink alcohol or drive while taking this medication.  May cause drowsiness. 08/16/24  Yes Stuart Vernell Norris, PA-C  chlorhexidine  (HIBICLENS ) 4 % external liquid Apply topically daily as needed. 12/04/23   Stuart Vernell Norris, PA-C  mupirocin  ointment (BACTROBAN ) 2 % Apply 1 Application topically 2 (two) times daily. 12/04/23   Stuart Vernell Norris, PA-C  naproxen  (NAPROSYN ) 375 MG tablet Take 1 tablet (375 mg total) by mouth 2 (two) times daily. 04/21/21   Arloa Suzen RAMAN, NP  TETRACYCLINE HCL PO Take by mouth.    [provider]    Family History Family History  Problem Relation Age of Onset    Autism spectrum disorder Brother    Mental illness Brother        bipolar disorder, ADHD   Hypertension Mother    Sarcoidosis Father     Social History Social History   Tobacco Use   Smoking status: Never   Smokeless tobacco: Never  Substance Use Topics   Alcohol use: No    Alcohol/week: 0.0 standard drinks of alcohol   Drug use: No     Allergies   Patient has no known allergies.   Review of Systems Review of Systems Per HPI  Physical Exam Triage Vital Signs ED Triage Vitals  Encounter Vitals Group     BP 08/16/24 1636 111/75     Girls Systolic BP Percentile --      Girls Diastolic BP Percentile --      Boys Systolic BP Percentile --      Boys Diastolic BP Percentile --      Pulse Rate 08/16/24 1636 (!) 50     Resp 08/16/24 1636 18     Temp 08/16/24 1636 97.7 F (36.5 C)     Temp Source 08/16/24 1636 Oral     SpO2 08/16/24 1636 97 %     Weight --      Height --      Head Circumference --      Peak Flow --  Pain Score 08/16/24 1641 0     Pain Loc --      Pain Education --      Exclude from Growth Chart --    No data found.  Updated Vital Signs BP 111/75 (BP Location: Right Arm)   Pulse (!) 50   Temp 97.7 F (36.5 C) (Oral)   Resp 18   SpO2 97%   Visual Acuity Right Eye Distance:   Left Eye Distance:   Bilateral Distance:    Right Eye Near:   Left Eye Near:    Bilateral Near:     Physical Exam Vitals and nursing note reviewed.  Constitutional:      Appearance: Normal appearance.  HENT:     Head: Atraumatic.  Eyes:     Extraocular Movements: Extraocular movements intact.     Conjunctiva/sclera: Conjunctivae normal.  Cardiovascular:     Rate and Rhythm: Normal rate.  Pulmonary:     Effort: Pulmonary effort is normal.  Musculoskeletal:        General: Tenderness present. No swelling. Normal range of motion.     Cervical back: Normal range of motion and neck supple.     Comments: Bilateral cervical paraspinal musculature  tenderness to palpation.  No midline spinal tenderness to palpation diffusely.  Normal gait and range of motion.  Grip strength full and equal bilateral hands.  Skin:    General: Skin is warm and dry.  Neurological:     Mental Status: He is oriented to person, place, and time.     Comments: Bilateral upper extremities neurovascularly intact  Psychiatric:        Mood and Affect: Mood normal.        Thought Content: Thought content normal.        Judgment: Judgment normal.      UC Treatments / Results  Labs (all labs ordered are listed, but only abnormal results are displayed) Labs Reviewed - No data to display  EKG   Radiology No results found.  Procedures Procedures (including critical care time)  Medications Ordered in UC Medications - No data to display  Initial Impression / Assessment and Plan / UC Course  I have reviewed the triage vital signs and the nursing notes.  Pertinent labs & imaging results that were available during my care of the patient were reviewed by me and considered in my medical decision making (see chart for details).     Vital signs and exam reassuring today, consistent with neck strain.  Treat with Zanaflex , naproxen , heat, massage, stretches, rest.  Return for worsening symptoms.  Final Clinical Impressions(s) / UC Diagnoses   Final diagnoses:  Strain of neck muscle, initial encounter   Discharge Instructions   None    ED Prescriptions     Medication Sig Dispense Auth. Provider   tiZANidine  (ZANAFLEX ) 4 MG capsule Take 1 capsule (4 mg total) by mouth 3 (three) times daily as needed for muscle spasms. Do not drink alcohol or drive while taking this medication.  May cause drowsiness. 15 capsule Stuart Vernell Norris, PA-C   naproxen  (NAPROSYN ) 500 MG tablet Take 1 tablet (500 mg total) by mouth 2 (two) times daily as needed. 20 tablet Stuart Vernell Norris, [place]      PDMP not reviewed this encounter.   Stuart Vernell Norris,  [place] 08/16/24 1753

## 2024-08-16 NOTE — ED Triage Notes (Signed)
 Pt reports neck pain and tension ongoing since last week, feels the tension in the back if the neck below the head.pt has been using ibuprofen  helps until it wears off, has also tried icing and heating but has found no consistent relief.
